# Patient Record
Sex: Female | Born: 1960 | Race: White | Hispanic: No | Marital: Married | Smoking: Former smoker
Health system: Southern US, Community
[De-identification: ages and names within clinical notes are randomized; demographics above are authoritative.]

## PROBLEM LIST (undated history)

## (undated) HISTORY — PX: UTERINE FIBROID SURGERY: SHX826

## (undated) HISTORY — PX: OVARY SURGERY: SHX727

## (undated) HISTORY — PX: OVARIAN CYST REMOVAL: SHX89

## (undated) HISTORY — PX: OTHER SURGICAL HISTORY: SHX169

---

## 1999-05-09 ENCOUNTER — Other Ambulatory Visit: Admission: RE | Admit: 1999-05-09 | Discharge: 1999-05-09 | Payer: Self-pay | Admitting: Obstetrics and Gynecology

## 2000-08-26 ENCOUNTER — Other Ambulatory Visit: Admission: RE | Admit: 2000-08-26 | Discharge: 2000-08-26 | Payer: Self-pay | Admitting: Obstetrics and Gynecology

## 2001-02-01 ENCOUNTER — Encounter: Payer: Self-pay | Admitting: Family Medicine

## 2001-02-01 ENCOUNTER — Encounter: Admission: RE | Admit: 2001-02-01 | Discharge: 2001-02-01 | Payer: Self-pay | Admitting: Family Medicine

## 2001-07-06 ENCOUNTER — Ambulatory Visit (HOSPITAL_COMMUNITY): Admission: RE | Admit: 2001-07-06 | Discharge: 2001-07-06 | Payer: Self-pay | Admitting: Gastroenterology

## 2001-07-08 ENCOUNTER — Encounter: Admission: RE | Admit: 2001-07-08 | Discharge: 2001-07-08 | Payer: Self-pay | Admitting: Gastroenterology

## 2001-07-08 ENCOUNTER — Encounter: Payer: Self-pay | Admitting: Gastroenterology

## 2004-02-29 ENCOUNTER — Encounter: Admission: RE | Admit: 2004-02-29 | Discharge: 2004-02-29 | Payer: Self-pay | Admitting: Gastroenterology

## 2008-11-14 ENCOUNTER — Ambulatory Visit (HOSPITAL_COMMUNITY): Admission: RE | Admit: 2008-11-14 | Discharge: 2008-11-14 | Payer: Self-pay | Admitting: Obstetrics and Gynecology

## 2009-03-12 ENCOUNTER — Ambulatory Visit: Payer: Self-pay | Admitting: Radiology

## 2009-03-12 ENCOUNTER — Emergency Department (HOSPITAL_BASED_OUTPATIENT_CLINIC_OR_DEPARTMENT_OTHER): Admission: EM | Admit: 2009-03-12 | Discharge: 2009-03-12 | Payer: Self-pay | Admitting: Emergency Medicine

## 2010-09-28 ENCOUNTER — Encounter: Payer: Self-pay | Admitting: Obstetrics and Gynecology

## 2010-12-14 LAB — URINALYSIS, ROUTINE W REFLEX MICROSCOPIC
Glucose, UA: NEGATIVE mg/dL
Protein, ur: NEGATIVE mg/dL
Specific Gravity, Urine: 1.022 (ref 1.005–1.030)
pH: 6.5 (ref 5.0–8.0)

## 2010-12-14 LAB — CBC
HCT: 42.5 % (ref 36.0–46.0)
Hemoglobin: 14.5 g/dL (ref 12.0–15.0)
WBC: 11 10*3/uL — ABNORMAL HIGH (ref 4.0–10.5)

## 2010-12-14 LAB — BASIC METABOLIC PANEL
Chloride: 99 mEq/L (ref 96–112)
GFR calc non Af Amer: >60 mL/min (ref >60)
Glucose, Bld: 126 mg/dL — ABNORMAL HIGH (ref 70–99)
Potassium: 3.5 mEq/L (ref 3.5–5.1)
Sodium: 138 mEq/L (ref 135–145)

## 2010-12-14 LAB — DIFFERENTIAL
Eosinophils Relative: 1 % (ref 0–5)
Lymphocytes Relative: 24 % (ref 12–46)
Lymphs Abs: 2.6 10*3/uL (ref 0.7–4.0)
Monocytes Absolute: 0.9 10*3/uL (ref 0.1–1.0)

## 2011-01-23 NOTE — Op Note (Signed)
[place]  Patient:    Terri Hartman, Terri Hartman Visit Number: [id number] MRN: [id number]          Service Type: END Location: ENDO Attending Physician:  Charna Elizabeth Dictated by:   Anselmo Rod, M.D. Proc. Date: 07/06/01 Admit Date:  07/06/2001   CC:         Nolon Nations, M.D., M.P.H.   Operative Report  DATE OF BIRTH:  Jan 28, 1951  REFERRING PHYSICIAN:  Nolon Nations, M.D., M.P.H.  PROCEDURE PERFORMED:  Colonoscopy.  ENDOSCOPIST:  Anselmo Rod, M.D.  INSTRUMENT USED:  Olympus video colonoscope.  INDICATIONS FOR PROCEDURE:  The patient is a 50 year old white female with a history of abnormal weight loss and rectal bleeding.  Rule out colonic polyps, masses, hemorrhoids, etc.  PREPROCEDURE PREPARATION:  Informed consent was procured from the patient. The patient was fasted for eight hours prior to the procedure and prepped with a bottle of magnesium citrate and a gallon of NuLytely the night prior to the procedure.  PREPROCEDURE PHYSICAL:  The patient had stable vital signs.  Neck supple. Chest clear to auscultation.  S1, S2 regular.  Abdomen soft with normal abdominal bowel sounds.  DESCRIPTION OF PROCEDURE:  The patient was placed in the left lateral decubitus position and sedated with 100 mg of Demerol and 10 mg of Versed intravenously.  Once the patient was adequately sedated and maintained on low-flow oxygen and continuous cardiac monitoring, the Olympus video colonoscope was advanced from the rectum to the cecum with difficulty.  There was a large amount of residual stool in the colon, especially on the right side.  However, multiple washes were done.  No large masses or polyps were seen.  Very small lesions could have been missed.  The transverse colon and the left colon appeared healthy and without lesions.  The terminal ileum appeared normal as well.  There were no masses or polyps seen.  Small internal hemorrhoid  was seen on retroflexion in the rectum.  IMPRESSION: 1. No large masses or polyps present. 2. Large amount of residual stool in the right colon. 3. Small nonbleeding internal and external hemorrhoids.  RECOMMENDATIONS: 1. Repeat CBC today. 2. CT scan of the abdomen and pelvis this week. 3. Outpatient follow-up within the next two weeks.Dictated by:   Anselmo Rod, M.D. Attending Physician:  Charna Elizabeth DD:  07/07/01 TD:  07/07/01 Job: [id number]

## 2012-04-28 ENCOUNTER — Other Ambulatory Visit: Payer: Self-pay | Admitting: Obstetrics and Gynecology

## 2012-04-28 DIAGNOSIS — N63 Unspecified lump in unspecified breast: Secondary | ICD-10-CM

## 2012-05-03 ENCOUNTER — Other Ambulatory Visit: Payer: Self-pay

## 2012-05-23 ENCOUNTER — Other Ambulatory Visit: Payer: Self-pay | Admitting: Obstetrics and Gynecology

## 2012-06-10 ENCOUNTER — Other Ambulatory Visit: Payer: Self-pay | Admitting: Registered Nurse

## 2012-06-29 ENCOUNTER — Other Ambulatory Visit: Payer: Self-pay | Admitting: Dermatology

## 2012-07-07 ENCOUNTER — Other Ambulatory Visit: Payer: Self-pay | Admitting: Dermatology

## 2012-07-13 ENCOUNTER — Other Ambulatory Visit: Payer: Self-pay | Admitting: Dermatology

## 2012-09-21 ENCOUNTER — Other Ambulatory Visit: Payer: Self-pay | Admitting: Dermatology

## 2013-01-24 ENCOUNTER — Other Ambulatory Visit: Payer: Self-pay | Admitting: Dermatology

## 2013-03-22 ENCOUNTER — Encounter: Payer: Self-pay | Admitting: Internal Medicine

## 2013-07-12 ENCOUNTER — Other Ambulatory Visit: Payer: Self-pay | Admitting: Dermatology

## 2013-12-26 ENCOUNTER — Other Ambulatory Visit: Payer: Self-pay

## 2014-05-17 ENCOUNTER — Ambulatory Visit (INDEPENDENT_AMBULATORY_CARE_PROVIDER_SITE_OTHER): Payer: BC Managed Care – PPO | Admitting: Family Medicine

## 2014-05-17 VITALS — BP 144/94 | HR 125 | Temp 97.8°F | Resp 18 | Ht 66.0 in | Wt 166.0 lb

## 2014-05-17 DIAGNOSIS — R590 Localized enlarged lymph nodes: Secondary | ICD-10-CM

## 2014-05-17 DIAGNOSIS — J342 Deviated nasal septum: Secondary | ICD-10-CM

## 2014-05-17 DIAGNOSIS — B370 Candidal stomatitis: Secondary | ICD-10-CM

## 2014-05-17 DIAGNOSIS — R599 Enlarged lymph nodes, unspecified: Secondary | ICD-10-CM

## 2014-05-17 LAB — POCT CBC
GRANULOCYTE PERCENT: 79.7 % (ref 37–80)
HEMATOCRIT: 45.9 % (ref 37.7–47.9)
HEMOGLOBIN: 15.2 g/dL (ref 12.2–16.2)
LYMPH, POC: 1 (ref 0.6–3.4)
MCH, POC: 33.3 pg — AB (ref 27–31.2)
MCHC: 33.1 g/dL (ref 31.8–35.4)
MCV: 100.6 fL — AB (ref 80–97)
MID (cbc): 0.4 (ref 0–0.9)
MPV: 8.8 fL (ref 0–99.8)
POC GRANULOCYTE: 5.5 (ref 2–6.9)
POC LYMPH PERCENT: 14.9 %L (ref 10–50)
POC MID %: 5.4 % (ref 0–12)
Platelet Count, POC: 283 10*3/uL (ref 142–424)
RBC: 4.57 M/uL (ref 4.04–5.48)
RDW, POC: 12.5 %
WBC: 6.9 10*3/uL (ref 4.6–10.2)

## 2014-05-17 LAB — GLUCOSE, POCT (MANUAL RESULT ENTRY): POC GLUCOSE: 106 mg/dL — AB (ref 70–99)

## 2014-05-17 MED ORDER — CLOTRIMAZOLE 10 MG MT TROC: 10.0000 mg | Freq: Every day | OROMUCOSAL | Status: DC
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
## 2014-05-17 NOTE — Patient Instructions (Addendum)
Let the clotrimazole troche slowly dissolve in mouth - do not chew or swallow whole. Do this five times a day for two weeks. If your swollen lymphnodes in neck come back at all, please come back in immediately for further testing.  Thrush, Adult  Terri Hartman, also called oral candidiasis, is a fungal infection that develops in the mouth and throat and on the tongue. It causes white patches to form on the mouth and tongue. Terri Hartman is most common in older adults, but it can occur at any age.  Many cases of thrush are mild, but this infection can also be more serious. Terri Hartman can be a recurring problem for people who have chronic illnesses or who take medicines that limit the body's ability to fight infection. Because these people have difficulty fighting infections, the fungus that causes thrush can spread throughout the body. This can cause life-threatening blood or organ infections. CAUSES  Terri Hartman is usually caused by a yeast called Candida albicans. This fungus is normally present in small amounts in the mouth and on other mucous membranes. It usually causes no harm. However, when conditions are present that allow the fungus to grow uncontrolled, it invades surrounding tissues and becomes an infection. Less often, other Candida species can also lead to thrush.  RISK FACTORS Terri Hartman is more likely to develop in the following people:  People with an impaired ability to fight infection (weakened immune system).   Older adults.   People with HIV.   People with diabetes.   People with dry mouth (xerostomia).   Pregnant women.   People with poor dental care, especially those who have false teeth.   People who use antibiotic medicines.  SIGNS AND SYMPTOMS  Terri Hartman can be a mild infection that causes no symptoms. If symptoms develop, they may include:   A burning feeling in the mouth and throat. This can occur at the start of a thrush infection.   White patches that adhere to the mouth and  tongue. The tissue around the patches may be red, raw, and painful. If rubbed (during tooth brushing, for example), the patches and the tissue of the mouth may bleed easily.   A bad taste in the mouth or difficulty tasting foods.   Cottony feeling in the mouth.   Pain during eating and swallowing. DIAGNOSIS  Your health care provider can usually diagnose thrush by looking in your mouth and asking you questions about your health.  TREATMENT  Medicines that help prevent the growth of fungi (antifungals) are the standard treatment for thrush. These medicines are either applied directly to the affected area (topical) or swallowed (oral). The treatment will depend on the severity of the condition.  Mild Thrush Mild cases of thrush may clear up with the use of an antifungal mouth rinse or lozenges. Treatment usually lasts about 14 days.  Moderate to Severe Thrush  More severe thrush infections that have spread to the esophagus are treated with an oral antifungal medicine. A topical antifungal medicine may also be used.   For some severe infections, a treatment period longer than 14 days may be needed.   Oral antifungal medicines are almost never used during pregnancy because the fetus may be harmed. However, if a pregnant woman has a rare, severe thrush infection that has spread to her blood, oral antifungal medicines may be used. In this case, the risk of harm to the mother and fetus from the severe thrush infection may be greater than the risk posed by the use  of antifungal medicines.  Persistent or Recurrent Thrush For cases of thrush that do not go away or keep coming back, treatment may involve the following:   Treatment may be needed twice as long as the symptoms last.   Treatment will include both oral and topical antifungal medicines.   People with weakened immune systems can take an antifungal medicine on a continuous basis to prevent thrush infections.  It is important to  treat conditions that make you more likely to get thrush, such as diabetes or HIV.  HOME CARE INSTRUCTIONS   Only take over-the-counter or prescription medicine as directed by your health care provider. Talk to your health care provider about an over-the-counter medicine called gentian violet, which kills bacteria and fungi.   Eat plain, unflavored yogurt as directed by your health care provider. Check the label to make sure the yogurt contains live cultures. This yogurt can help healthy bacteria grow in the mouth that can stop the growth of the fungus that causes thrush.   Try these measures to help reduce the discomfort of thrush:   Drink cold liquids such as water or iced tea.   Try flavored ice treats or frozen juices.   Eat foods that are easy to swallow, such as gelatin, ice cream, or custard.   If the patches in your mouth are painful, try drinking from a straw.   Rinse your mouth several times a day with a warm saltwater rinse. You can make the saltwater mixture with 1 tsp (6 g) of salt in 8 fl oz (0.2 L) of warm water.   If you wear dentures, remove the dentures before going to bed, brush them vigorously, and soak them in a cleaning solution as directed by your health care provider.   Women who are breastfeeding should clean their nipples with an antifungal medicine as directed by their health care provider. Dry the nipples after breastfeeding. Applying lanolin-containing body lotion may help relieve nipple soreness.  SEEK MEDICAL CARE IF:  Your symptoms are getting worse or are not improving within 7 days of starting treatment.   You have symptoms of spreading infection, such as white patches on the skin outside of the mouth.   You are nursing and you have redness, burning, or pain in the nipples that is not relieved with treatment.  MAKE SURE YOU:  Understand these instructions.  Will watch your condition.  Will get help right away if you are not doing well  or get worse. Document Released: 05/19/2004 Document Revised: 06/14/2013 Document Reviewed: 03/27/2013 [facility] Patient Information 2015 [place], [place]. This information is not intended to replace advice given to you by your health care provider. Make sure you discuss any questions you have with your health care provider.

## 2014-06-05 ENCOUNTER — Telehealth: Payer: Self-pay

## 2014-06-05 DIAGNOSIS — J342 Deviated nasal septum: Secondary | ICD-10-CM

## 2014-06-05 NOTE — Telephone Encounter (Signed)
Pt was un happy with dr Ezzard Standingnewman and would like a referral to dr shoemaker  Best number [phone]

## 2014-06-06 NOTE — Telephone Encounter (Signed)
Referral placed.

## 2014-06-06 NOTE — Telephone Encounter (Signed)
If pt wants a second opinion that is completely fine to refer to the provider of her choice, thanks. es

## 2015-01-18 ENCOUNTER — Ambulatory Visit (INDEPENDENT_AMBULATORY_CARE_PROVIDER_SITE_OTHER): Payer: BLUE CROSS/BLUE SHIELD | Admitting: Family Medicine

## 2015-01-18 VITALS — BP 124/80 | HR 70 | Temp 98.8°F | Resp 18 | Ht 67.0 in | Wt 173.0 lb

## 2015-01-18 DIAGNOSIS — B029 Zoster without complications: Secondary | ICD-10-CM | POA: Diagnosis not present

## 2015-01-18 DIAGNOSIS — M79601 Pain in right arm: Secondary | ICD-10-CM

## 2015-01-18 MED ORDER — VALACYCLOVIR HCL 1 G PO TABS: 1000.0000 mg | ORAL_TABLET | Freq: Three times a day (TID) | ORAL | Status: DC

## 2015-01-18 NOTE — Progress Notes (Signed)
[facility] [address], [place] [id number] [phone]  Date:  01/18/2015   Name:  Terri Hartman   DOB:  03/29/1961   MRN:  [id number]  PCP:  No PCP Per Patient    Chief Complaint: Arm Pain and Rash   History of Present Illness:  Terri Hartman is a 54 y.o. very pleasant female patient who presents with the following:  Generally healthy lady here today with a problem with her right arm.   About one week ago she lifted weights- no arm pain after her work- out.  However later on in the day she noted onset of pain in her right arm and elbow.  She thought that she might have a muscle strain and tried wearing a tennis elbow strap.  However she then noted onset of a rash- throught that perhaps she was allergic to the strap.  She checked and the strap was latex free.  The rash has continued to spread down the right forearm and is quite uncomfortable.  Tender, sore to light touch, tingly, unusual pain OW she feels well, no other rash, no fever or other systemic sx She is a non -smoker, works for a nutritionist   There are no active problems to display for this patient.   History reviewed. No pertinent past medical history.  Past Surgical History  Procedure Laterality Date  . Ovary surgery    . Uterine fibroid surgery      History  Substance Use Topics  . Smoking status: Former Games developermoker  . Smokeless tobacco: Not on file  . Alcohol Use: Not on file    Family History  Problem Relation Age of Onset  . Hypertension Mother   . Cancer Father     Allergies  Allergen Reactions  . Thimerosal Other (See Comments)    Numbness   . Latex Rash    Medication list has been reviewed and updated.  Current Outpatient Prescriptions on File Prior to Visit  Medication Sig Dispense Refill  . clotrimazole (MYCELEX) 10 MG troche Take 1 tablet (10 mg total) by mouth 5 (five) times daily. (Patient not taking: Reported on 01/18/2015) 70 tablet 0   No current  facility-administered medications on file prior to visit.    Review of Systems:  As per HPI- otherwise negative.   Physical Examination: Filed Vitals:   01/18/15 1107  BP: 124/80  Pulse: 110  Temp: 98.8 F (37.1 C)  Resp: 18   Filed Vitals:   01/18/15 1107  Height: 5\' 7"  (1.702 Hartman)  Weight: 173 lb (78.472 kg)   Body mass index is 27.09 kg/(Hartman^2). Ideal Body Weight: Weight in (lb) to have BMI = 25: 159.3  GEN: WDWN, NAD, Non-toxic, A & O x 3, mild overweight, looks well HEENT: Atraumatic, Normocephalic. Neck supple. No masses, No LAD. Bilateral TM wnl, oropharynx normal.  PEERL,EOMI.   Ears and Nose: No external deformity. CV: RRR, No Hartman/G/R. No JVD. No thrill. No extra heart sounds. PULM: CTA B, no wheezes, crackles, rhonchi. No retractions. No resp. distress. No accessory muscle use. ABD: S, NT, ND, +BS. No rebound. No HSM. EXTR: No c/c/e NEURO Normal gait.  PSYCH: Normally interactive. Conversant. Not depressed or anxious appearing.  Calm demeanor.  Right arm- she has a likely shingles rash on the ventral right forearm [facility] fossa down towards the wrist.   Elbow, wrist and shoulder OW normal  Assessment and Plan: Shingles - Plan: valACYclovir (VALTREX) 1000 MG tablet  Right arm pain  Treat for shingles with valtrex.  Discussed contagion   Signed Abbe AmsterdamJessica Copland, MD

## 2015-01-18 NOTE — Patient Instructions (Signed)
Use the valtrex three times a day for one week for shingles.  Use OTC pain relievers as needed Avoid pregnant women and infants/ non- immunized children Let me know if you are getting worse or if you have any other concerns or symptoms

## 2020-12-17 ENCOUNTER — Telehealth: Payer: Self-pay

## 2020-12-17 NOTE — Telephone Encounter (Signed)
Please advise 

## 2020-12-17 NOTE — Telephone Encounter (Signed)
Yes I am excepting family members of current patients

## 2020-12-17 NOTE — Telephone Encounter (Signed)
Please advise and schedule 

## 2020-12-17 NOTE — Telephone Encounter (Signed)
Pt is requesting to be a new pt of Dr. Durene Cal. She says her husband and son see Dr. Durene Cal and that she is Terri Hartman's daughter in Social worker. Please advise

## 2020-12-20 DIAGNOSIS — F419 Anxiety disorder, unspecified: Secondary | ICD-10-CM | POA: Diagnosis not present

## 2021-10-13 ENCOUNTER — Telehealth: Payer: Self-pay

## 2021-10-13 DIAGNOSIS — J029 Acute pharyngitis, unspecified: Secondary | ICD-10-CM | POA: Diagnosis not present

## 2021-10-13 NOTE — Telephone Encounter (Signed)
Please get the names of Husband and son and send back for the approval to schedule.

## 2021-10-13 NOTE — Telephone Encounter (Signed)
Pt would like to become a new patient with Dr Yong Channel. Her husband and son are current patients with him. Please advise. Thank you

## 2021-10-14 NOTE — Telephone Encounter (Signed)
Husband is Terri Hartman, mrn [phone]

## 2021-10-15 ENCOUNTER — Emergency Department (HOSPITAL_BASED_OUTPATIENT_CLINIC_OR_DEPARTMENT_OTHER)
Admission: EM | Admit: 2021-10-15 | Discharge: 2021-10-15 | Disposition: A | Discharge status: Home / Self Care | Payer: BC Managed Care – PPO | Attending: Emergency Medicine | Admitting: Emergency Medicine

## 2021-10-15 ENCOUNTER — Emergency Department (HOSPITAL_BASED_OUTPATIENT_CLINIC_OR_DEPARTMENT_OTHER): Payer: BC Managed Care – PPO | Admitting: Radiology

## 2021-10-15 ENCOUNTER — Other Ambulatory Visit: Payer: Self-pay

## 2021-10-15 ENCOUNTER — Encounter (HOSPITAL_BASED_OUTPATIENT_CLINIC_OR_DEPARTMENT_OTHER): Payer: Self-pay

## 2021-10-15 DIAGNOSIS — J029 Acute pharyngitis, unspecified: Secondary | ICD-10-CM | POA: Insufficient documentation

## 2021-10-15 DIAGNOSIS — R000 Tachycardia, unspecified: Secondary | ICD-10-CM | POA: Diagnosis not present

## 2021-10-15 DIAGNOSIS — Z20822 Contact with and (suspected) exposure to covid-19: Secondary | ICD-10-CM | POA: Diagnosis not present

## 2021-10-15 DIAGNOSIS — Z91040 Latex allergy status: Secondary | ICD-10-CM | POA: Insufficient documentation

## 2021-10-15 DIAGNOSIS — R0602 Shortness of breath: Secondary | ICD-10-CM | POA: Diagnosis not present

## 2021-10-15 DIAGNOSIS — R682 Dry mouth, unspecified: Secondary | ICD-10-CM | POA: Diagnosis not present

## 2021-10-15 LAB — BASIC METABOLIC PANEL
Anion gap: 14 (ref 5–15)
BUN: 11 mg/dL (ref 8–23)
CO2: 24 mmol/L (ref 22–32)
Calcium: 9.7 mg/dL (ref 8.9–10.3)
Chloride: 98 mmol/L (ref 98–111)
Creatinine, Ser: 0.74 mg/dL (ref 0.44–1.00)
GFR, Estimated: >60 mL/min (ref >60)
Glucose, Bld: 128 mg/dL — ABNORMAL HIGH (ref 70–99)
Potassium: 3.2 mmol/L — ABNORMAL LOW (ref 3.5–5.1)
Sodium: 136 mmol/L (ref 135–145)

## 2021-10-15 LAB — CBC WITH DIFFERENTIAL/PLATELET
Abs Immature Granulocytes: 0.01 10*3/uL (ref 0.00–0.07)
Basophils Absolute: 0 10*3/uL (ref 0.0–0.1)
Basophils Relative: 1 %
Eosinophils Absolute: 0.1 10*3/uL (ref 0.0–0.5)
Eosinophils Relative: 2 %
HCT: 45.3 % (ref 36.0–46.0)
Hemoglobin: 15.7 g/dL — ABNORMAL HIGH (ref 12.0–15.0)
Immature Granulocytes: 0 %
Lymphocytes Relative: 31 %
Lymphs Abs: 2.1 10*3/uL (ref 0.7–4.0)
MCH: 33 pg (ref 26.0–34.0)
MCHC: 34.7 g/dL (ref 30.0–36.0)
MCV: 95.2 fL (ref 80.0–100.0)
Monocytes Absolute: 0.7 10*3/uL (ref 0.1–1.0)
Monocytes Relative: 10 %
Neutro Abs: 3.9 10*3/uL (ref 1.7–7.7)
Neutrophils Relative %: 56 %
Platelets: 317 10*3/uL (ref 150–400)
RBC: 4.76 MIL/uL (ref 3.87–5.11)
RDW: 11.3 % — ABNORMAL LOW (ref 11.5–15.5)
WBC: 6.9 10*3/uL (ref 4.0–10.5)
nRBC: 0 % (ref 0.0–0.2)

## 2021-10-15 LAB — TROPONIN I (HIGH SENSITIVITY)
Troponin I (High Sensitivity): 6 ng/L (ref <18)
Troponin I (High Sensitivity): 6 ng/L (ref <18)

## 2021-10-15 LAB — D-DIMER, QUANTITATIVE: D-Dimer, Quant: 0.33 ug/mL-FEU (ref 0.00–0.50)

## 2021-10-15 LAB — RESP PANEL BY RT-PCR (FLU A&B, COVID) ARPGX2
Influenza A by PCR: NEGATIVE
Influenza B by PCR: NEGATIVE
SARS Coronavirus 2 by RT PCR: NEGATIVE

## 2021-10-15 LAB — BRAIN NATRIURETIC PEPTIDE: B Natriuretic Peptide: 21.9 pg/mL (ref 0.0–100.0)

## 2021-10-15 MED ORDER — IPRATROPIUM-ALBUTEROL 0.5-2.5 (3) MG/3ML IN SOLN
3.0000 mL | Freq: Once | RESPIRATORY_TRACT | Status: AC
  Administered 2021-10-15: 3 mL via RESPIRATORY_TRACT
  Filled 2021-10-15: qty 3

## 2021-10-15 MED ORDER — HYDROCHLOROTHIAZIDE 12.5 MG PO TABS: 12.5000 mg | ORAL_TABLET | Freq: Every day | ORAL | 1 refills | Status: DC

## 2021-10-15 MED ORDER — ALBUTEROL SULFATE HFA 108 (90 BASE) MCG/ACT IN AERS: 1.0000 | INHALATION_SPRAY | Freq: Four times a day (QID) | RESPIRATORY_TRACT | 0 refills | Status: DC | PRN

## 2021-10-15 NOTE — ED Triage Notes (Signed)
Shortness of breath since around 2100 last night.  Also states she's constipated with last normal BM this Monday.

## 2021-10-15 NOTE — ED Notes (Signed)
Patient transported to X-ray 

## 2021-10-15 NOTE — ED Provider Notes (Signed)
Patient signed out to me by previous provider. Please refer to their note for full HPI.  Briefly this is a 61 year old female who presented to the emergency department chest pain.  Patient symptoms sounds atypical.  Low suspicion from previous provider in regards to ACS/PE.  Was tachycardic on arrival.  Blood work thus far has been reassuring, first troponin is negative.  We are pending repeat troponin, D-dimer and reevaluation. Physical Exam  BP (!) 165/96    Pulse 91    Temp 98.2 F (36.8 C) (Oral)    Resp 12    Ht 5\' 6"  (1.676 m)    Wt 74.8 kg    SpO2 100%    BMI 26.63 kg/m   Physical Exam Vitals and nursing note reviewed.  Constitutional:      General: She is not in acute distress.    Appearance: Normal appearance. She is not diaphoretic.  HENT:     Head: Normocephalic.     Mouth/Throat:     Mouth: Mucous membranes are moist.  Cardiovascular:     Rate and Rhythm: Normal rate.  Pulmonary:     Effort: Pulmonary effort is normal. No respiratory distress.  Abdominal:     Palpations: Abdomen is soft.     Tenderness: There is no abdominal tenderness.  Skin:    General: Skin is warm.  Neurological:     Mental Status: She is alert and oriented to person, place, and time. Mental status is at baseline.  Psychiatric:        Mood and Affect: Mood normal.    Procedures  Procedures  ED Course / MDM    Medical Decision Making Amount and/or Complexity of Data Reviewed Labs: ordered. Radiology: ordered.  Risk Prescription drug management.   Repeat troponin is negative.  D-dimer is normal.  Tachycardia has resolved.  Patient feels well.  Could be mild upper airway inflammation from a viral infection, improved with DuoNeb.  Patient is requesting medication for high blood pressure.  We discussed the possibility of following up with the primary doctor for repeat blood pressure reading prior to prescription however patient is adamant that she needs to start new medication so I will place  her on a low-dose of medication today.  Patient at this time appears safe and stable for discharge and close outpatient follow up. Discharge plan and strict return to ED precautions discussed, patient verbalizes understanding and agreement.       , DO 10/15/21 1008

## 2021-10-15 NOTE — ED Notes (Signed)
Patient given discharge instructions. Questions were answered. Patient verbalized understanding of discharge instructions and care at home.  

## 2021-10-15 NOTE — ED Provider Notes (Signed)
[facility] EMERGENCY DEPT Provider Note   CSN: [id number] Arrival date & time: 10/15/21  1829     History  Chief Complaint  Patient presents with   Shortness of Breath    Terri Hartman is a 61 y.o. female.  The history is provided by the patient.  Shortness of Breath She has no significant past history and comes in complaining of shortness of breath which started last night.  She started getting sick 2 days ago with a sore throat.  She went to an urgent care center where she was given a prescription for amoxicillin for possible streptococcal disease.  Several days earlier, she had temperature as high as 99.9, but has not had an actual fever.  She denies chills or sweats.  She denies any cough.  She denies nausea, vomiting, diarrhea.  Her throat actually started feeling better, but she started having shortness of breath last night.  She noted that her heart was beating fast.  Dyspnea is worse when she lays flat.  She is now complaining that her mouth is dry.  She is a non-smoker and denies history of hypertension or diabetes or hyperlipidemia, but she does endorse whitecoat hypertension.  She denies any sick contacts.  She did take a home COVID test which was negative.   Home Medications Prior to Admission medications   Medication Sig Start Date End Date Taking? Authorizing Provider  clotrimazole (MYCELEX) 10 MG troche Take 1 tablet (10 mg total) by mouth 5 (five) times daily. Patient not taking: Reported on 01/18/2015 05/17/14   Sherren Mocha, MD  valACYclovir (VALTREX) 1000 MG tablet Take 1 tablet (1,000 mg total) by mouth 3 (three) times daily. 01/18/15   Copland, Gwenlyn Found, MD      Allergies    Thimerosal and Latex    Review of Systems   Review of Systems  Respiratory:  Positive for shortness of breath.   All other systems reviewed and are negative.  Physical Exam Updated Vital Signs BP (!) 183/102    Pulse (!) 120    Temp 98.2 F (36.8 C) (Oral)    Resp 20    Ht 5'  6" (1.676 m)    Wt 74.8 kg    SpO2 99%    BMI 26.63 kg/m  Physical Exam Vitals and nursing note reviewed.  61 year old female, resting comfortably and in no acute distress. Vital signs are significant for rapid heart rate and elevated blood pressure. Oxygen saturation is 99%, which is normal. Head is normocephalic and atraumatic. PERRLA, EOMI. Oropharynx is clear. Neck is nontender and supple without adenopathy or JVD. Back is nontender and there is no CVA tenderness. Lungs are clear without rales, wheezes, or rhonchi.  However, there is a slightly prolonged exhalation phase. Chest is nontender. Heart has regular rate and rhythm without murmur. Abdomen is soft, flat, nontender. Extremities have no cyanosis or edema, full range of motion is present. Skin is warm and dry without rash. Neurologic: Mental status is normal, cranial nerves are intact, moves all extremities equally.  ED Results / Procedures / Treatments   Labs (all labs ordered are listed, but only abnormal results are displayed) Labs Reviewed  CBC WITH DIFFERENTIAL/PLATELET - Abnormal; Notable for the following components:      Result Value   Hemoglobin 15.7 (*)    RDW 11.3 (*)    All other components within normal limits  RESP PANEL BY RT-PCR (FLU A&B, COVID) ARPGX2  BASIC METABOLIC PANEL  BRAIN  NATRIURETIC PEPTIDE  D-DIMER, QUANTITATIVE  TROPONIN I (HIGH SENSITIVITY)    EKG EKG Interpretation  Date/Time:  Wednesday October 15 2021 06:32:47 EST Ventricular Rate:  115 PR Interval:  180 QRS Duration: 84 QT Interval:  323 QTC Calculation: 447 R Axis:   90 Text Interpretation: Sinus tachycardia Premature ventricular complexes Borderline right axis deviation Minimal ST depression, inferior leads No old tracing to compare Confirmed by Dione Booze ([id number]) on 10/15/2021 6:36:17 AM  Radiology DG Chest 2 View  Result Date: 10/15/2021 CLINICAL DATA:  Shortness of breath. EXAM: CHEST - 2 VIEW COMPARISON:  PA Lat  03/12/2009. FINDINGS: There is small increased opacity in the lingular base compared to the prior study which could be due to interval lingular scarring, lingular atelectasis or a small pneumonia. The remaining lungs are clear. Heart size and vasculature and the mediastinal configuration are normal. Mild thoracic dextroscoliosis. IMPRESSION: Small lingular infiltrate versus atelectasis or scarring new since 2010. Follow-up as indicated. Electronically Signed   By: Almira Bar M.D.   On: 10/15/2021 06:59    Procedures Procedures  Per my interpretation, cardiac monitor shows sinus tachycardia.  Medications Ordered in ED Medications  ipratropium-albuterol (DUONEB) 0.5-2.5 (3) MG/3ML nebulizer solution 3 mL (has no administration in time range)    ED Course/ Medical Decision Making/ A&P                           Medical Decision Making Amount and/or Complexity of Data Reviewed Labs: ordered. Radiology: ordered.  Risk Prescription drug management.   Dyspnea which does appear to be part of a viral illness given recent sore throat.  Need to consider viral etiologies such as influenza and COVID-19.  Respiratory pathogen panel was sent.  ECG is obtained showing sinus tachycardia and a single PVC.  Because of tachycardia, cannot rule out pulmonary embolism and D-dimer is sent.  Also need to consider angina equivalent, will check troponin.  There is some suggestion of mild bronchospasm, will give therapeutic trial of albuterol with ipratropium.  Old records are reviewed, and she has no relevant past visits.    CBC is normal.  Troponin and D-dimer are pending as is a respiratory pathogen panel.  Chest x-ray shows questionable lingular infiltrate versus atelectasis.  I have independently viewed the images, and agree with radiologist interpretation.  Case is signed out to Dr. Wilkie Aye.        Final Clinical Impression(s) / ED Diagnoses Final diagnoses:  Shortness of breath  Sinus tachycardia     Rx / DC Orders ED Discharge Orders     None         Dione Booze, MD 10/15/21 0710

## 2021-10-15 NOTE — Discharge Instructions (Signed)
You have been seen and discharged from the emergency department.  Your cardiac and lung work-up were normal.  You have been placed on a low-dose high blood pressure medication.  It is important that you establish primary care for reevaluation of your blood pressure.  Use inhaler as needed. Take home medications as prescribed. If you have any worsening symptoms or further concerns for your health please return to an emergency department for further evaluation.

## 2021-10-16 ENCOUNTER — Emergency Department (HOSPITAL_BASED_OUTPATIENT_CLINIC_OR_DEPARTMENT_OTHER)
Admission: EM | Admit: 2021-10-16 | Discharge: 2021-10-16 | Disposition: A | Discharge status: Home / Self Care | Payer: BC Managed Care – PPO | Attending: Emergency Medicine | Admitting: Emergency Medicine

## 2021-10-16 ENCOUNTER — Encounter (HOSPITAL_BASED_OUTPATIENT_CLINIC_OR_DEPARTMENT_OTHER): Payer: Self-pay

## 2021-10-16 ENCOUNTER — Other Ambulatory Visit: Payer: Self-pay

## 2021-10-16 DIAGNOSIS — T887XXA Unspecified adverse effect of drug or medicament, initial encounter: Secondary | ICD-10-CM | POA: Diagnosis not present

## 2021-10-16 DIAGNOSIS — T452X5A Adverse effect of vitamins, initial encounter: Secondary | ICD-10-CM | POA: Insufficient documentation

## 2021-10-16 DIAGNOSIS — R202 Paresthesia of skin: Secondary | ICD-10-CM | POA: Insufficient documentation

## 2021-10-16 DIAGNOSIS — T50905A Adverse effect of unspecified drugs, medicaments and biological substances, initial encounter: Secondary | ICD-10-CM

## 2021-10-16 DIAGNOSIS — T7840XA Allergy, unspecified, initial encounter: Secondary | ICD-10-CM | POA: Diagnosis not present

## 2021-10-16 DIAGNOSIS — Z91040 Latex allergy status: Secondary | ICD-10-CM | POA: Diagnosis not present

## 2021-10-16 NOTE — ED Triage Notes (Signed)
Pt presents with numbness to his lips, pressure to her head, ears and throat after taking Asorbic Acid 1130 this am. Pt took 1 benadryl, laid down felt better, pt is here d/t ongoing lip numbness. She was seen here yesterday for SOB. Feels this medicine may be the cause of her issues. Pt started this medication Monday as a Vit C supplement   Pt speaking complete sentences, 100% RA, no oral edema

## 2021-10-16 NOTE — ED Notes (Signed)
Dc instructions reviewed with patient. Patient voiced understanding. Dc with belongings.  °

## 2021-10-16 NOTE — Discharge Instructions (Signed)
Some of the symptoms could be due to the vitamin C.  Stop it for now.  Potentially you could also switch from Benadryl to Zyrtec.

## 2021-10-17 NOTE — ED Provider Notes (Signed)
[facility] EMERGENCY DEPT Provider Note   CSN: [id number] Arrival date & time: 10/16/21  1332     History  Chief Complaint  Patient presents with   Allergic Reaction    Terri Hartman is a 61 y.o. female.   Allergic Reaction Patient presents thinking she is having allergic reaction.  States numbness to lips ears and throat.  States she gets itchy.  States has been going for a while now.  Thinks it may be related to her vitamin C.  States she is taking new vitamin C because she thought that it was related to fillers that were in the before.  Had been seen in the ER yesterday for shortness of breath.  Extensive work-up reassuring.  States she has been dealing with histamine for a while now.  States that shortness of breath is improved.    Home Medications Prior to Admission medications   Medication Sig Start Date End Date Taking? Authorizing Provider  albuterol (VENTOLIN HFA) 108 (90 Base) MCG/ACT inhaler Inhale 1 puff into the lungs every 6 (six) hours as needed for wheezing or shortness of breath. 10/15/21   Horton, Clabe Seal, DO  amoxicillin (AMOXIL) 875 MG tablet Take 875 mg by mouth 2 (two) times daily. 10/13/21   [provider]  clotrimazole (MYCELEX) 10 MG troche Take 1 tablet (10 mg total) by mouth 5 (five) times daily. Patient not taking: Reported on 01/18/2015 05/17/14   Sherren Mocha, MD  hydrochlorothiazide (HYDRODIURIL) 12.5 MG tablet Take 1 tablet (12.5 mg total) by mouth daily. 10/15/21   Horton, Clabe Seal, DO  valACYclovir (VALTREX) 1000 MG tablet Take 1 tablet (1,000 mg total) by mouth 3 (three) times daily. 01/18/15   Copland, Gwenlyn Found, MD      Allergies    Thimerosal and Latex    Review of Systems   Review of Systems  Constitutional:  Negative for appetite change.  Respiratory:  Negative for shortness of breath.   Cardiovascular:  Negative for chest pain.  Gastrointestinal:  Negative for abdominal pain.   Physical Exam Updated Vital Signs BP  (!) 184/96    Pulse (!) 103    Temp 98.7 F (37.1 C)    Resp 18    SpO2 99%  Physical Exam Vitals and nursing note reviewed.  HENT:     Head: Atraumatic.  Cardiovascular:     Rate and Rhythm: Regular rhythm.  Abdominal:     Tenderness: There is no abdominal tenderness.  Musculoskeletal:        General: No tenderness.     Cervical back: Neck supple.  Neurological:     Mental Status: She is alert.    ED Results / Procedures / Treatments   Labs (all labs ordered are listed, but only abnormal results are displayed) Labs Reviewed - No data to display  EKG None  Radiology DG Chest 2 View  Result Date: 10/15/2021 CLINICAL DATA:  Shortness of breath. EXAM: CHEST - 2 VIEW COMPARISON:  PA Lat 03/12/2009. FINDINGS: There is small increased opacity in the lingular base compared to the prior study which could be due to interval lingular scarring, lingular atelectasis or a small pneumonia. The remaining lungs are clear. Heart size and vasculature and the mediastinal configuration are normal. Mild thoracic dextroscoliosis. IMPRESSION: Small lingular infiltrate versus atelectasis or scarring new since 2010. Follow-up as indicated. Electronically Signed   By: Almira Bar M.D.   On: 10/15/2021 06:59    Procedures Procedures    Medications  Ordered in ED Medications - No data to display  ED Course/ Medical Decision Making/ A&P                           Medical Decision Making  Patient presents feeling as if she is having allergic reaction.  States she gets itchy.  States it is worse after she takes her vitamin C.  States she has been taking that for couple months now and after she takes that she has more of the symptoms.  Well-appearing.  Head extensive work-up for shortness of breath yesterday.  No edema in her mouth.  Doubt acute allergic reaction but could be a reaction to the vitamin C.  Patient will stop taking it.  For now we will just do that.  Also has been on Benadryl to control  "histamine".  Potentially could take a cetirizine instead but will try that after little bit to see if symptoms improve by stopping the vitamin C.  Do not feels we need further blood work at this time.  Stable for discharge home.        Final Clinical Impression(s) / ED Diagnoses Final diagnoses:  Medication reaction, initial encounter    Rx / DC Orders ED Discharge Orders     None         Benjiman Core, MD 10/17/21 0045

## 2021-10-18 DIAGNOSIS — H43811 Vitreous degeneration, right eye: Secondary | ICD-10-CM | POA: Diagnosis not present

## 2021-10-21 DIAGNOSIS — H5203 Hypermetropia, bilateral: Secondary | ICD-10-CM | POA: Diagnosis not present

## 2021-11-04 DIAGNOSIS — H43811 Vitreous degeneration, right eye: Secondary | ICD-10-CM | POA: Diagnosis not present

## 2022-04-06 DIAGNOSIS — L570 Actinic keratosis: Secondary | ICD-10-CM | POA: Diagnosis not present

## 2022-04-06 DIAGNOSIS — L814 Other melanin hyperpigmentation: Secondary | ICD-10-CM | POA: Diagnosis not present

## 2022-04-30 ENCOUNTER — Ambulatory Visit: Payer: BC Managed Care – PPO | Admitting: Family Medicine

## 2022-04-30 ENCOUNTER — Encounter: Payer: Self-pay | Admitting: Family Medicine

## 2022-04-30 VITALS — BP 125/88 | HR 96 | Temp 97.8°F | Ht 66.0 in | Wt 178.2 lb

## 2022-04-30 DIAGNOSIS — Z0000 Encounter for general adult medical examination without abnormal findings: Secondary | ICD-10-CM

## 2022-04-30 DIAGNOSIS — Z13220 Encounter for screening for lipoid disorders: Secondary | ICD-10-CM

## 2022-04-30 DIAGNOSIS — Z1159 Encounter for screening for other viral diseases: Secondary | ICD-10-CM

## 2022-04-30 DIAGNOSIS — K9049 Malabsorption due to intolerance, not elsewhere classified: Secondary | ICD-10-CM

## 2022-04-30 DIAGNOSIS — H3321 Serous retinal detachment, right eye: Secondary | ICD-10-CM

## 2022-04-30 DIAGNOSIS — Z1211 Encounter for screening for malignant neoplasm of colon: Secondary | ICD-10-CM | POA: Diagnosis not present

## 2022-04-30 DIAGNOSIS — I10 Essential (primary) hypertension: Secondary | ICD-10-CM | POA: Diagnosis not present

## 2022-04-30 DIAGNOSIS — Z114 Encounter for screening for human immunodeficiency virus [HIV]: Secondary | ICD-10-CM

## 2022-04-30 MED ORDER — HYDROXYZINE HCL 10 MG PO TABS
5.0000 mg | ORAL_TABLET | Freq: Two times a day (BID) | ORAL | 3 refills | Status: AC | PRN
Start: 2022-04-30 — End: ?

## 2022-04-30 MED ORDER — AMLODIPINE BESYLATE 2.5 MG PO TABS: 2.5000 mg | ORAL_TABLET | Freq: Every day | ORAL | 5 refills | Status: DC

## 2022-04-30 NOTE — Assessment & Plan Note (Addendum)
#  histamine intolerance and healthcare related anxiety S: eats fish gets vertigo and pork gets dizzy. Avocados, Parmesan cheese, pickles- reportedly high in histamine give her headaches. Green peppers and watermelon cause indigestion. Malaise with nuts. Rash in groin with wheat. Sunflower seeds cause insomnia. Tries to eat very healthy.  - tried Quercitin (she reports natural antihistamine) - rare benadryl about 10 per year  Also reports high healthcare related anxiety leading to whitecoat hypertension and feels extremely/exceedingly anxious before office visits.  Has had some traumatic incidents such as prior mammography with [facility] A/P: With patient's baseline histamine intolerance as well as high healthcare anxiety-we opted to try low-dose hydroxyzine 5 to 10 mg as needed-can also use before visits as long as does not feel overly sedated -Also encouraged patient to consider therapy as sounds like may have PTSD-like symptoms around healthcare

## 2022-04-30 NOTE — Progress Notes (Signed)
Phone [phone]   Subjective:  Patient presents today for their annual physical and to transfer care from [facility]- NP for Dr. Renne Crigler. Chief complaint-noted.   See problem oriented charting- ROS- full  review of systems was completed and negative except for: tinnitus (stable- comes and goes with histamine issues), anxiety about healthy, insomnia,   The following were reviewed and entered/updated in epic: History reviewed. No pertinent past medical history. 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 died when she was 30   Early death Father    Healthy Sister    Alcohol abuse Brother    Stroke Maternal Grandmother        79s   CAD Paternal Grandfather        21s   Healthy Son     Medications- reviewed and updated Current Outpatient Medications  Medication Sig Dispense Refill   amLODipine (NORVASC) 2.5 MG tablet Take 1 tablet (2.5 mg total) by mouth daily. 30 tablet 5   hydrOXYzine (ATARAX) 10 MG tablet Take 0.5-1 tablets (5-10 mg total) by mouth 2 (two) times daily as needed for anxiety or itching. 30 tablet 3   No current facility-administered medications for this visit.    Allergies-reviewed and updated Allergies  Allergen Reactions   Thimerosal Other (See Comments)    Numbness    Latex Rash    Social History   Social History Narrative   Not on file   Objective  Objective:  BP 125/88 Comment: average home readings over last 15 readings  Pulse 96   Temp 97.8 F (36.6 C)   Ht 5\' 6"  (1.676 m)   Wt  178 lb 3.2 oz (80.8 kg)   SpO2 98%   BMI 28.76 kg/m  Gen: Anxious appearing through most of visit but improves as visit goes on HEENT: Mucous membranes are moist. Oropharynx normal Neck: no thyromegaly CV: RRR no murmurs rubs or gallops Lungs: CTAB no crackles, wheeze, rhonchi Abdomen: soft/nontender/nondistended/normal bowel sounds. No rebound or guarding.  Ext: no edema Skin: warm, dry Neuro: grossly normal, moves all extremities, PERRLA   Assessment and Plan   61 y.o. female presenting for annual physical.  Health Maintenance counseling: 1. Anticipatory guidance: Patient counseled regarding regular dental exams - advised q6 months, eye exams - more regularly due to retinal issues,  avoiding smoking and second hand smoke , limiting alcohol to 1 beverage per day- 4 a week , no illicit drugs .   2. Risk factor reduction:  Advised patient of need for regular exercise and diet rich and fruits and vegetables to reduce risk of heart attack and stroke.  Exercise- walking at least 30 minutes 4-5 days a week and toook up golf.  Diet/weight management-stable in 170s for most part- advised gradual weight loss. Consider some strength training Wt Readings from Last 3 Encounters:  04/30/22 178 lb 3.2 oz (80.8 kg)  10/15/21  165 lb (74.8 kg)  01/18/15 173 lb (78.5 kg)  3. Immunizations/screenings/ancillary studies- opting out further covid shots. Opts out of flu shot. Opts in HCV and HIV screen Immunization History  Administered Date(s) Administered   PFIZER(Purple Top)SARS-COV-2 Vaccination 04/23/2020, 05/14/2020  4. Cervical cancer screening- last GYN exam 5 years ago- Dr. Huntley Dec in past but may see Dr. Thyra Breed in [place] 5. Breast cancer screening-  breast exam - with GYN- and mammogram DECLINES- bad experience in the past with a [facility] biopsy- fortunately was benign- rather traumatic for her- would consider at breast center possibly but leans away- opts out for now 6. Colon cancer screening -  refer to Dr. Dulce Sellar 7. Skin cancer screening- sees derm regularly. advised regular sunscreen use. Denies worrisome, changing, or new skin lesions.  8. Birth control/STD check- postmenopausal/monogamous 9. Osteoporosis screening at 70- consider at 80- wants to hold off 10. Smoking associated screening - former smoker- quit over 30 years ago- no regular screening needed  Status of chronic or acute concerns   #hypertension- white coat hypertension and healthcare anxiety -See problem-oriented note  #histamine intolerance and healthcare related anxiety -See problem-oriented note  #Prior SOB with virus earlier this year- cbc normal, troponin and d dimer reassuring, CXR questionable lingular infiltrate versus atelectasis- was told likely related to virus. Had been to urgent care 1-2 days before. Covid was negative. 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 Chelated out until 0 reading and has no fillings at this point  Recommended follow up: Return in about 2 months (around 06/30/2022).  Lab/Order associations:NOT fasting- 2 scrambled eggs this am   ICD-10-CM   1. Preventative health care  Z00.00 HIV Antibody (routine testing w rflx)    Hepatitis C antibody    CBC with Differential/Platelet    Comprehensive metabolic panel    Lipid panel    Urinalysis, Routine w reflex microscopic    2. Essential hypertension  I10 CBC with Differential/Platelet    Comprehensive metabolic panel    Urinalysis, Routine w reflex microscopic    3. Food intolerance  K90.49     4. Detached retina, right  H33.21     5. Screen for colon cancer  Z12.11 Ambulatory referral to Gastroenterology    6. Screening for hyperlipidemia  Z13.220 Lipid panel    7. Encounter for hepatitis  C screening test for low risk patient  Z11.59 Hepatitis C antibody    8. Screening for HIV (human immunodeficiency virus)  Z11.4 HIV Antibody (routine testing w rflx)      Return precautions advised.  Tana Conch, MD

## 2022-04-30 NOTE — Patient Instructions (Addendum)
Please schedule GYN visit - you are in need of updated pap smear  Pretty please consider mammogram- even if you just do every other year  We will call you within two weeks about your referral to [facility]. If you do not hear within 2 weeks, give them a call.   Consider therapy- EMDR or brain spotting may be particularly helpful  Trial hydroxyzine as needed for anxiety/itching  Try amlodipine 2.5 mg in the morning- can start with half tablet and update me in 2 weeks and see me in 2 months  Please stop by lab before you go If you have mychart- we will send your results within 3 business days of [place] receiving them.  If you do not have mychart- we will call you about results within 5 business days of [place] receiving them.  *please also note that you will see labs on mychart as soon as they post. I will later go in and write notes on them- will say "notes from Dr. Durene Cal"   Recommended follow up: Return in about 2 months (around 06/30/2022).

## 2022-04-30 NOTE — Assessment & Plan Note (Addendum)
#  hypertension- white coat hypertension and healthcare anxiety S: medication: none . Has been on hctz in the past through GYN but later came off of this- was told "you are good" Home readings #s: over last 15 readings  avg 125.1333 87.53333  Resting HR 67 on apple watch and up to 75. Today took blood pressure even before visit and was already rising -[address] [place] trails- tries to walk daily but usually 4-5 days a week and walks 30-35 minutes - has tried to cut down on salt about 2 weeks ago - last night 128/90 and day before 121/80 BP Readings from Last 3 Encounters:  04/30/22 (!) 154/88 today on repeat but home average much lower and listed as final blood pressure reading 125/88  10/16/21 (!) 184/96  10/15/21 (!) 150/105  A/P: mild poor control on home readings ( prefer home readings diastolics average under 85)- more elevated in office but has white coat element/anxiety- we will start low dose amlodipine 2.5 mg- can start with half and if controls remain on lowest dose-asked her to update me in 2 weeks and then see me in 2 months.

## 2022-04-30 NOTE — Progress Notes (Signed)
Phone [phone] In person visit   Subjective:   Terri Hartman is a 61 y.o. 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 Avocados, Parmesan cheese, pickles- reportedly high in histamine give her headaches. Green peppers and watermelon cause indigestion. Malaise with nuts. Rash in groin with wheat. Sunflower seeds cause insomnia. Tries to eat very healthy.  - tried Quercitin (she reports natural antihistamine) - rare benadryl about 10 per year  Also reports high healthcare related anxiety leading to whitecoat hypertension and feels extremely/exceedingly anxious before office visits.  Has had some traumatic incidents such as prior mammography with [facility]: With patient's baseline histamine intolerance as well as high healthcare anxiety-we opted to try low-dose hydroxyzine 5 to 10 mg as needed-can also use before visits as long as does not feel overly sedated -Also encouraged patient to consider therapy as sounds like may have PTSD-like symptoms around healthcare  Essential hypertension #hypertension- white coat hypertension and healthcare anxiety S: medication: none . Has been on hctz in the past through GYN but later came off of this- was told "you are good" Home readings  #s: over last 15 readings  avg 125.1333 87.53333  Resting HR 67 on apple watch and up to 75. Today took blood pressure even before visit and was already rising -[address] [place] trails- tries to walk daily but usually 4-5 days a week and walks 30-35 minutes - has tried to cut down on salt about 2 weeks ago - last night 128/90 and day before 121/80 BP Readings from Last 3 Encounters:  04/30/22 (!) 154/88 today on repeat but home average much lower and listed as final blood pressure reading 125/88  10/16/21 (!) 184/96  10/15/21 (!) 150/105  A/P: mild poor control on home readings ( prefer home readings diastolics average under 85)- more elevated in office but has white coat element/anxiety- we will start low dose amlodipine 2.5 mg- can start with half and if controls remain on lowest dose-asked her to update me in 2 weeks and then see me in 2 months.    Recommended follow up: Return in about 2 months (around 06/30/2022).  Lab/Order associations:   ICD-10-CM   1. Essential hypertension  I10 CBC with Differential/Platelet    Comprehensive metabolic panel    Urinalysis, Routine w reflex microscopic    2. Food intolerance  K90.49      Meds ordered this encounter  Medications   hydrOXYzine (ATARAX) 10 MG tablet    Sig: Take 0.5-1 tablets (5-10 mg total) by mouth 2 (two) times daily as needed for anxiety or itching.    Dispense:  30 tablet    Refill:  3   amLODipine (NORVASC) 2.5 MG tablet    Sig: Take 1 tablet (2.5 mg total) by mouth daily.    Dispense:  30 tablet    Refill:  5    Return precautions advised.  Tana Conch, MD

## 2022-05-01 LAB — COMPREHENSIVE METABOLIC PANEL
ALT: 17 U/L (ref 0–35)
AST: 15 U/L (ref 0–37)
Albumin: 4.7 g/dL (ref 3.5–5.2)
Alkaline Phosphatase: 63 U/L (ref 39–117)
BUN: 15 mg/dL (ref 6–23)
CO2: 26 mEq/L (ref 19–32)
Calcium: 9.9 mg/dL (ref 8.4–10.5)
Chloride: 92 mEq/L — ABNORMAL LOW (ref 96–112)
Creatinine, Ser: 0.76 mg/dL (ref 0.40–1.20)
GFR: 84.08 mL/min (ref >60.00)
Glucose, Bld: 111 mg/dL — ABNORMAL HIGH (ref 70–99)
Potassium: 4.4 mEq/L (ref 3.5–5.1)
Sodium: 129 mEq/L — ABNORMAL LOW (ref 135–145)
Total Bilirubin: 0.8 mg/dL (ref 0.2–1.2)
Total Protein: 7.2 g/dL (ref 6.0–8.3)

## 2022-05-01 LAB — URINALYSIS, ROUTINE W REFLEX MICROSCOPIC
Bilirubin Urine: NEGATIVE
Leukocytes,Ua: NEGATIVE
Nitrite: NEGATIVE
Specific Gravity, Urine: 1.01 (ref 1.000–1.030)
Urine Glucose: NEGATIVE
Urobilinogen, UA: 0.2 (ref 0.0–1.0)
pH: 6 (ref 5.0–8.0)

## 2022-05-01 LAB — CBC WITH DIFFERENTIAL/PLATELET
Basophils Absolute: 0 10*3/uL (ref 0.0–0.1)
Basophils Relative: 0.6 % (ref 0.0–3.0)
Eosinophils Absolute: 0 10*3/uL (ref 0.0–0.7)
Eosinophils Relative: 0.4 % (ref 0.0–5.0)
HCT: 43.1 % (ref 36.0–46.0)
Hemoglobin: 14.8 g/dL (ref 12.0–15.0)
Lymphocytes Relative: 13.6 % (ref 12.0–46.0)
Lymphs Abs: 1.1 10*3/uL (ref 0.7–4.0)
MCHC: 34.3 g/dL (ref 30.0–36.0)
MCV: 98.8 fl (ref 78.0–100.0)
Monocytes Absolute: 0.4 10*3/uL (ref 0.1–1.0)
Monocytes Relative: 5.7 % (ref 3.0–12.0)
Neutro Abs: 6.2 10*3/uL (ref 1.4–7.7)
Neutrophils Relative %: 79.7 % — ABNORMAL HIGH (ref 43.0–77.0)
Platelets: 292 10*3/uL (ref 150.0–400.0)
RBC: 4.37 Mil/uL (ref 3.87–5.11)
RDW: 12.2 % (ref 11.5–15.5)
WBC: 7.7 10*3/uL (ref 4.0–10.5)

## 2022-05-01 LAB — LIPID PANEL
Cholesterol: 202 mg/dL — ABNORMAL HIGH (ref 0–200)
HDL: 67.1 mg/dL (ref >39.00)
LDL Cholesterol: 122 mg/dL — ABNORMAL HIGH (ref 0–99)
NonHDL: 134.9
Total CHOL/HDL Ratio: 3
Triglycerides: 66 mg/dL (ref 0.0–149.0)
VLDL: 13.2 mg/dL (ref 0.0–40.0)

## 2022-05-05 NOTE — Progress Notes (Signed)
LVM to call back to schedule 2 month fu.

## 2022-07-01 ENCOUNTER — Other Ambulatory Visit (INDEPENDENT_AMBULATORY_CARE_PROVIDER_SITE_OTHER): Payer: BC Managed Care – PPO

## 2022-07-01 ENCOUNTER — Ambulatory Visit: Payer: BC Managed Care – PPO | Admitting: Family Medicine

## 2022-07-01 ENCOUNTER — Encounter: Payer: Self-pay | Admitting: Family Medicine

## 2022-07-01 VITALS — BP 121/84 | HR 108 | Temp 98.0°F | Ht 66.0 in | Wt 183.4 lb

## 2022-07-01 DIAGNOSIS — I10 Essential (primary) hypertension: Secondary | ICD-10-CM

## 2022-07-01 DIAGNOSIS — E871 Hypo-osmolality and hyponatremia: Secondary | ICD-10-CM

## 2022-07-01 DIAGNOSIS — Z1159 Encounter for screening for other viral diseases: Secondary | ICD-10-CM

## 2022-07-01 DIAGNOSIS — Z8349 Family history of other endocrine, nutritional and metabolic diseases: Secondary | ICD-10-CM

## 2022-07-01 LAB — BASIC METABOLIC PANEL
BUN: 18 mg/dL (ref 6–23)
CO2: 28 mEq/L (ref 19–32)
Calcium: 9.3 mg/dL (ref 8.4–10.5)
Chloride: 96 mEq/L (ref 96–112)
Creatinine, Ser: 0.66 mg/dL (ref 0.40–1.20)
GFR: 94.01 mL/min (ref >60.00)
Glucose, Bld: 95 mg/dL (ref 70–99)
Potassium: 4.5 mEq/L (ref 3.5–5.1)
Sodium: 132 mEq/L — ABNORMAL LOW (ref 135–145)

## 2022-07-01 LAB — TSH: TSH: 1.27 u[IU]/mL (ref 0.35–5.50)

## 2022-07-01 NOTE — Progress Notes (Signed)
  Phone [phone] In person visit   Subjective:   Terri Hartman is a 61 y.o. year old very pleasant female patient who presents for/with See problem oriented charting Chief Complaint  Patient presents with   Hypertension    Brought list of BP home readings Did not let me check BP here in the office  Has not been taking amlodipine    Past Medical History-  Patient Active Problem List   Diagnosis Date Noted   Detached retina, right 04/30/2022    Priority: Medium    Food intolerance 04/30/2022    Priority: Medium    Essential hypertension 04/30/2022    Priority: Medium    Medications- reviewed and updated Current Outpatient Medications  Medication Sig Dispense Refill   hydrOXYzine (ATARAX) 10 MG tablet Take 0.5-1 tablets (5-10 mg total) by mouth 2 (two) times daily as needed for anxiety or itching. 30 tablet 3   No current facility-administered medications for this visit.     Objective:  BP 121/84 Comment: average of last 9 home readings  Pulse (!) 108   Temp 98 F (36.7 C) (Temporal)   Ht 5\' 6"  (1.676 m)   Wt 183 lb 6.4 oz (83.2 kg)   SpO2 95%   BMI 29.60 kg/m  Gen: NAD, resting comfortably    Assessment and Plan   #hypertension S: medication: no formal rx- Amlodipine 2.5 mg prescribed- she has not taken this  but has started a supplement blood pressure suupport by [employer] which includes, b6, L- arginine, magnesium, taurine, hawthorn flower, onion and pumpkin extract, hibiscus flower powder, olive leaf extract, grape seed extract, coq 10- Greg nutrionist recommended this and has worke dwell without side effects -Had been on hydrochlorothiazide in the past through GYN -started yoga with her husband and loves it  Home readings #s:  avg 195.0932 67.12458   - down from 125.1/87.5 last visit BP Readings from Last 3 Encounters:  07/01/22 121/84  04/30/22 125/88  10/16/21 (!) 184/96  A/P: blood pressure controlled without prescription medicine- OTC has been  helpful through nutritionist- she would like to continue this and we set goal <135/85 for average of home readings  #hyponatremia- possible lab error- will send to [facility] for draw  #family history hashimoto's- check TSH Lab Results  Component Value Date   TSH 3.911 -Test methodology is 3rd generation TSH 03/12/2009   #History of histamine intolerance- has worked on stress and histamines better. Yoga and meditation helpful- has not needed hydroxyzine   Recommended follow up: Return in about 10 months (around 05/02/2023) for physical or sooner if needed.Schedule b4 you leave.  Lab/Order associations:   ICD-10-CM   1. Essential hypertension  I10 TSH    2. Hyponatremia  K99.8 Basic metabolic panel    CANCELED: Basic metabolic panel    3. Family history of hypothyroidism  Z83.49 TSH    4. Encounter for hepatitis C screening test for low risk patient  Z11.59 Hepatitis C antibody     No orders of the defined types were placed in this encounter.  Time Spent: 23 minutes of total time (11:12 AM- 11:35 AM) was spent on the date of the encounter performing the following actions: chart review prior to seeing the patient, obtaining history, counseling on the treatment plan including alternative therapy counseling (also discussed possible power breathe) , placing orders, and documenting in our EHR.   Return precautions advised.  Garret Reddish, MD

## 2022-07-01 NOTE — Patient Instructions (Addendum)
Health Maintenance Due  Topic Date Due   PAP SMEAR-Modifier - pretty please schedule GYN visit Never done   COLONOSCOPY - scheduling with [facility] Never done   Please go to [facility]  - located [address] across the [address] from [place] - in the basement - Hours: 7:30-5:30 PM M-F. You do NOT need an appointment.   Goal at home average <135/85- the closer to 110/70 without lightheadedness the better -great job with yoga -supplement seems to be helping  Recommended follow up: Return in about 10 months (around 05/02/2023) for physical or sooner if needed.Schedule b4 you leave.

## 2022-07-02 LAB — HEPATITIS C ANTIBODY: Hepatitis C Ab: NONREACTIVE

## 2022-12-16 DIAGNOSIS — H5203 Hypermetropia, bilateral: Secondary | ICD-10-CM | POA: Diagnosis not present

## 2023-01-11 DIAGNOSIS — H43812 Vitreous degeneration, left eye: Secondary | ICD-10-CM | POA: Diagnosis not present

## 2023-01-25 DIAGNOSIS — H43812 Vitreous degeneration, left eye: Secondary | ICD-10-CM | POA: Diagnosis not present

## 2023-01-26 DIAGNOSIS — M25562 Pain in left knee: Secondary | ICD-10-CM | POA: Diagnosis not present

## 2023-04-07 ENCOUNTER — Encounter (INDEPENDENT_AMBULATORY_CARE_PROVIDER_SITE_OTHER): Payer: Self-pay

## 2023-05-04 ENCOUNTER — Encounter: Payer: BC Managed Care – PPO | Admitting: Family Medicine

## 2023-06-02 IMAGING — DX DG CHEST 2V
2 series · 2 of 2 positions shown · non-contrast
Comparison: PA Lat 03/12/2009.

CLINICAL DATA: Shortness of breath.

EXAM:
CHEST - 2 VIEW

[chest pa]
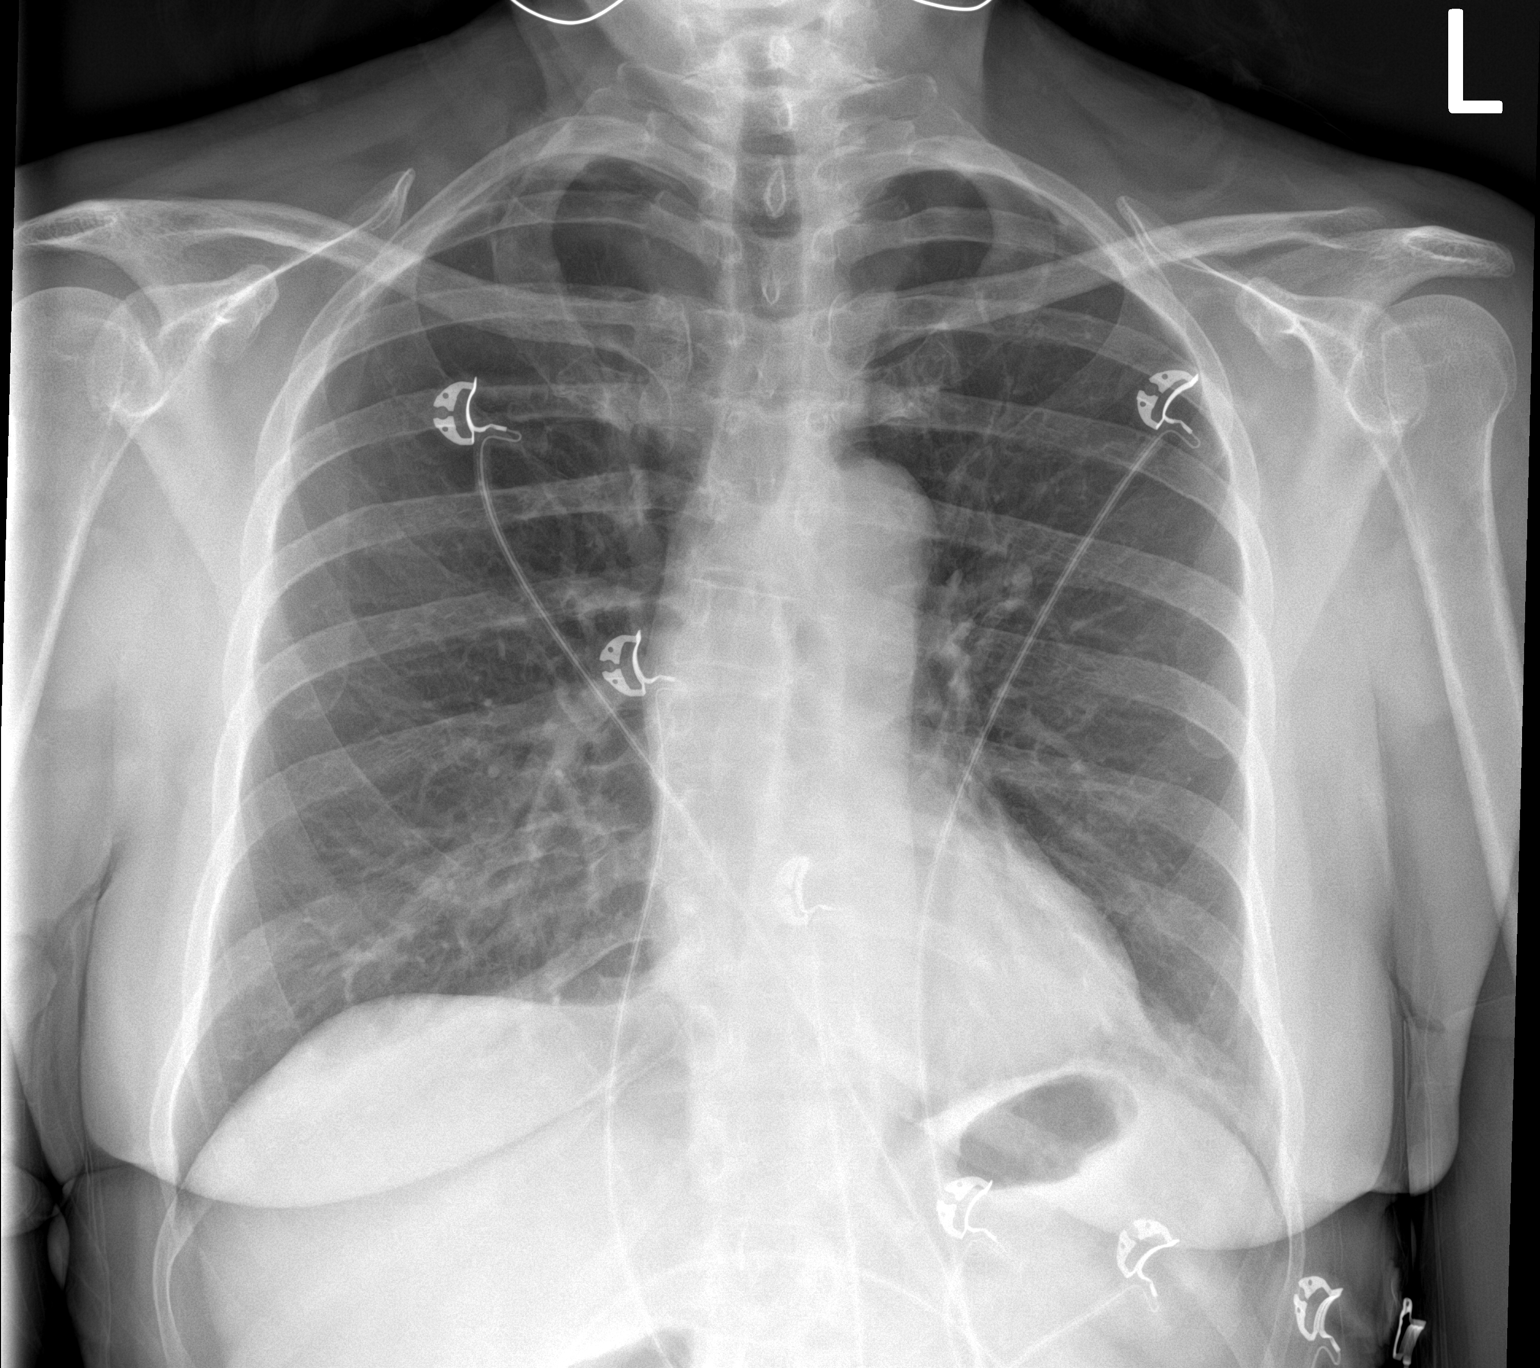

[chest lat]
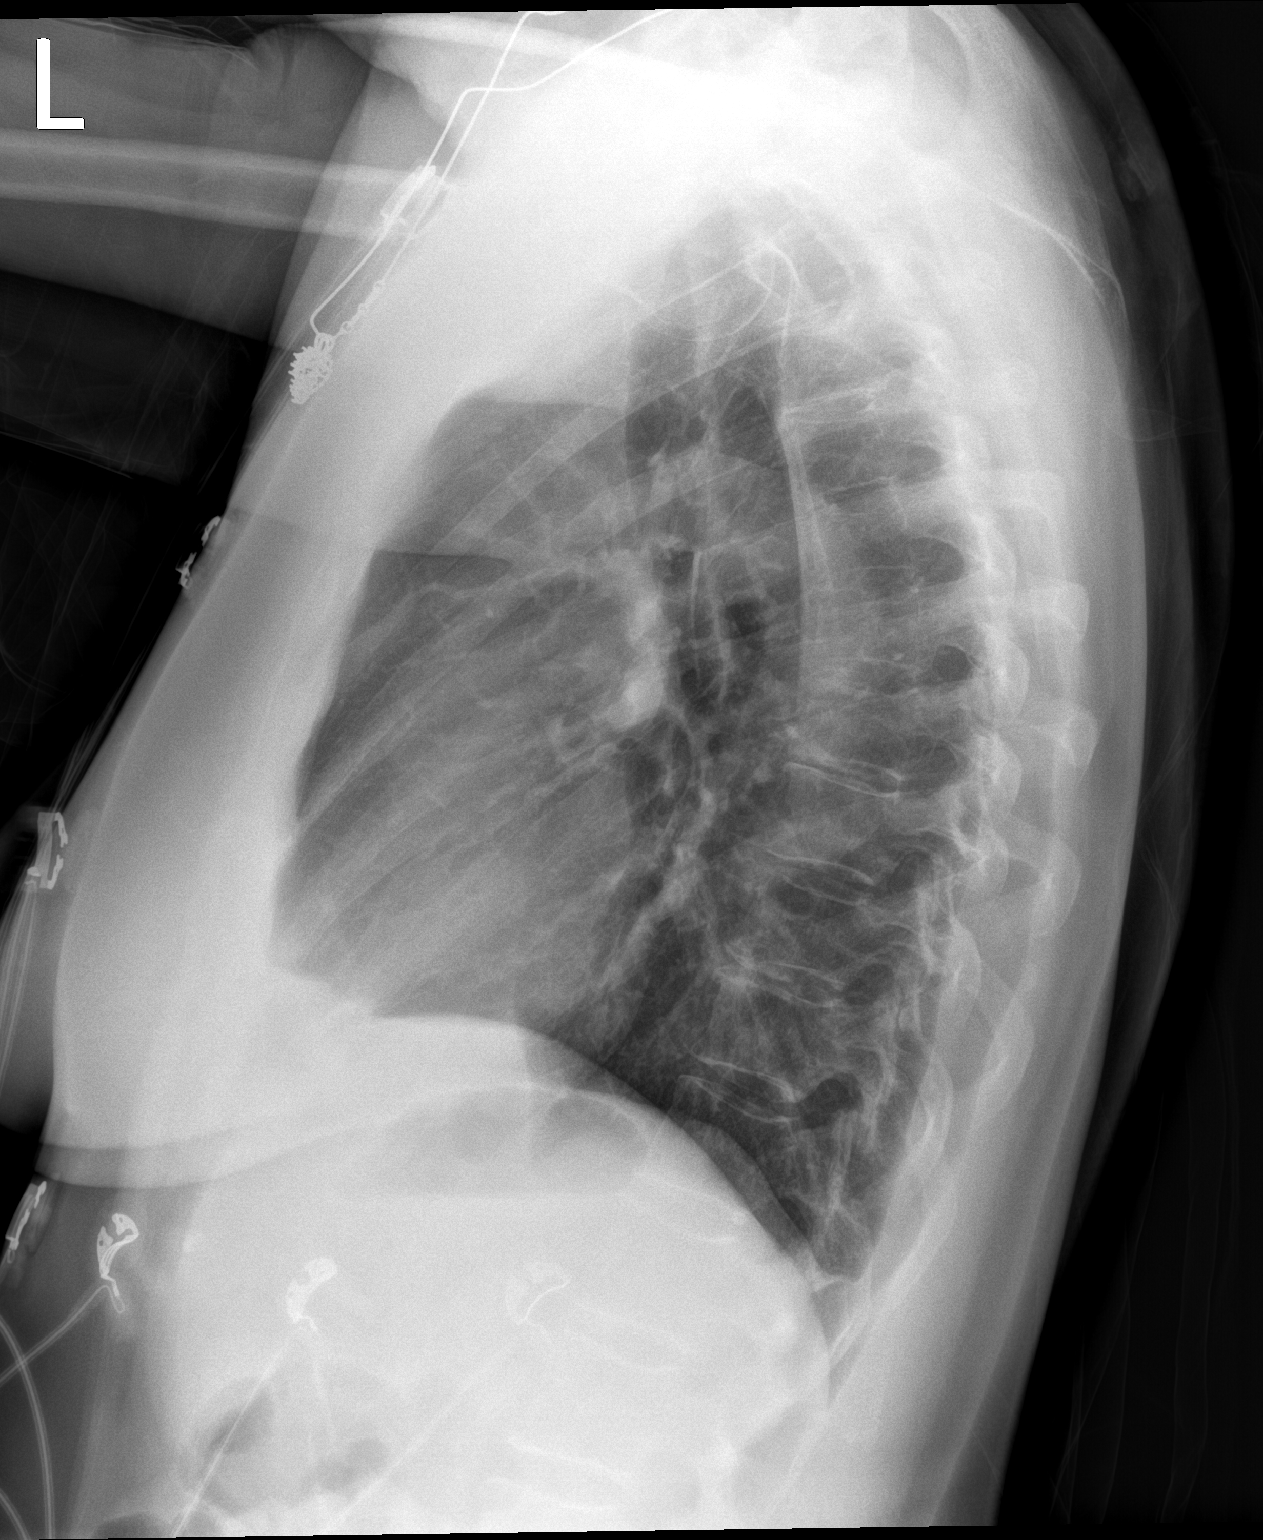

[2 of 2 positions shown; findings below may reference images not displayed]

FINDINGS: There is small increased opacity in the lingular base compared to
the prior study which could be due to interval lingular scarring,
lingular atelectasis or a small pneumonia.

The remaining lungs are clear. Heart size and vasculature and the
mediastinal configuration are normal. Mild thoracic dextroscoliosis.
IMPRESSION: Small lingular infiltrate versus atelectasis or scarring new since
0404. Follow-up as indicated.

## 2023-06-11 ENCOUNTER — Other Ambulatory Visit: Payer: Self-pay | Admitting: Family Medicine

## 2023-06-11 DIAGNOSIS — Z1211 Encounter for screening for malignant neoplasm of colon: Secondary | ICD-10-CM

## 2023-06-11 DIAGNOSIS — Z1212 Encounter for screening for malignant neoplasm of rectum: Secondary | ICD-10-CM

## 2023-06-29 DIAGNOSIS — Z1212 Encounter for screening for malignant neoplasm of rectum: Secondary | ICD-10-CM | POA: Diagnosis not present

## 2023-06-29 DIAGNOSIS — Z1211 Encounter for screening for malignant neoplasm of colon: Secondary | ICD-10-CM | POA: Diagnosis not present

## 2023-07-07 LAB — COLOGUARD: COLOGUARD: NEGATIVE

## 2023-08-17 DIAGNOSIS — L820 Inflamed seborrheic keratosis: Secondary | ICD-10-CM | POA: Diagnosis not present

## 2023-08-17 DIAGNOSIS — L814 Other melanin hyperpigmentation: Secondary | ICD-10-CM | POA: Diagnosis not present

## 2023-08-17 DIAGNOSIS — D225 Melanocytic nevi of trunk: Secondary | ICD-10-CM | POA: Diagnosis not present

## 2023-08-17 DIAGNOSIS — Z85828 Personal history of other malignant neoplasm of skin: Secondary | ICD-10-CM | POA: Diagnosis not present

## 2023-12-29 DIAGNOSIS — H524 Presbyopia: Secondary | ICD-10-CM | POA: Diagnosis not present

## 2024-06-27 DIAGNOSIS — L820 Inflamed seborrheic keratosis: Secondary | ICD-10-CM | POA: Diagnosis not present

## 2024-06-27 DIAGNOSIS — D225 Melanocytic nevi of trunk: Secondary | ICD-10-CM | POA: Diagnosis not present

## 2024-06-27 DIAGNOSIS — M67441 Ganglion, right hand: Secondary | ICD-10-CM | POA: Diagnosis not present

## 2024-06-27 DIAGNOSIS — D485 Neoplasm of uncertain behavior of skin: Secondary | ICD-10-CM | POA: Diagnosis not present
